# Patient Record
Sex: Female | Born: 1975 | Hispanic: Yes | Marital: Married | Smoking: Never smoker
Health system: Southern US, Community
[De-identification: ages and names within clinical notes are randomized; demographics above are authoritative.]

---

## 2005-08-20 ENCOUNTER — Ambulatory Visit: Payer: Self-pay | Admitting: Family Medicine

## 2020-12-27 ENCOUNTER — Encounter: Payer: Self-pay | Admitting: *Deleted

## 2020-12-27 ENCOUNTER — Ambulatory Visit: Payer: Self-pay | Attending: Oncology | Admitting: *Deleted

## 2020-12-27 ENCOUNTER — Ambulatory Visit
Admission: RE | Admit: 2020-12-27 | Discharge: 2020-12-27 | Disposition: A | Discharge status: Home / Self Care | Payer: Self-pay | Source: Ambulatory Visit | Attending: Oncology | Admitting: Oncology

## 2020-12-27 ENCOUNTER — Other Ambulatory Visit: Payer: Self-pay

## 2020-12-27 VITALS — BP 126/78 | HR 73 | Temp 96.8°F | Ht 64.96 in | Wt 203.3 lb

## 2020-12-27 DIAGNOSIS — Z0000 Encounter for general adult medical examination without abnormal findings: Secondary | ICD-10-CM | POA: Insufficient documentation

## 2020-12-27 NOTE — Progress Notes (Signed)
  Subjective:     Patient ID: Beth Pollard, female   DOB: 1976/01/07, 45 y.o.   MRN: [id number]  HPI   [facility] Medical History Record - 12/27/20 7482      Breast History   Screening cycle New    Provider ([facility]) [facility]    Initial Mammogram 12/27/20    Last Mammogram Never    Recent Breast Symptoms None      Breast Cancer History   Breast Cancer History No personal or family history      Previous History of Breast Problems   Breast Surgery or Biopsy None    Breast Implants N/A    BSE Done Monthly      Gynecological/Obstetrical History   LMP --   Has IUD   Is there any chance that the client could be pregnant?  No    Age at menarche 33    Age at menopause n/a    PAP smear history Annually    Date of last PAP  11/26/20    Provider ([facility]) [facility]    Age at first live birth 35    Breast fed children No    DES Exposure No    Cervical, Uterine or Ovarian cancer No    Family history of Cervial, Uterine or Ovarian cancer No    Hysterectomy No    Cervix removed No    Ovaries removed No    Laser/Cryosurgery No    Current method of birth control --   IUD   Current method of Estrogen/Hormone replacement None    Smoking history None    Comments No insurance            Review of Systems     Objective:   Physical Exam Chest:  Breasts:     Right: Inverted nipple present. No swelling, bleeding, mass, nipple discharge, skin change, tenderness, axillary adenopathy or supraclavicular adenopathy.     Left: No swelling, bleeding, inverted nipple, mass, nipple discharge, skin change, tenderness, axillary adenopathy or supraclavicular adenopathy.        Comments: Bilateral inverted nipples.  Normal for the patient Lymphadenopathy:     Upper Body:     Right upper body: No supraclavicular or axillary adenopathy.     Left upper body: No supraclavicular or axillary adenopathy.        Assessment:     45 year old Hispanic female presents to [facility]  for clinical breast exam and baseline mammogram.   Loyda, the interpreter present during the interview and exam.  Clinical breast exam without dominant mass, skin changes, nipple discharge or lymphadenopathy.  Taught self breast awareness.  Last pap on 11/26/20 at the [facility].  Those results are not available for review.  She should follow up per [facility]P guidelines. Patient has been screened for eligibility.  She does not have any insurance, Medicare or Medicaid.  She also meets financial eligibility.   Risk Assessment    Risk Scores      12/27/2020   Last edited by: Scarlett Presto, RN   5-year risk: 0.7 %   Lifetime risk: 7.4 %            Plan:     Screening mammogram ordered.  Will follow up per [facility] protocol.

## 2020-12-27 NOTE — Patient Instructions (Signed)
Gave patient hand-out, Women Staying Healthy, Active and Well from [facility], with education on breast health, pap smears, heart and colon health. 

## 2020-12-29 NOTE — Progress Notes (Signed)
Letter mailed from the [facility] to inform patient of her normal mammogram results.  Patient is to follow-up with annual screening in one year. 

## 2021-11-04 IMAGING — MG MM DIGITAL SCREENING BILAT W/ TOMO AND CAD
6 of 10 series · 6 of 30 positions shown · non-contrast
Comparison: None.

CLINICAL DATA: Screening.

EXAM:
DIGITAL SCREENING BILATERAL MAMMOGRAM WITH TOMOSYNTHESIS AND CAD
TECHNIQUE: Bilateral screening digital craniocaudal and mediolateral oblique
mammograms were obtained. Bilateral screening digital breast
tomosynthesis was performed. The images were evaluated with
computer-aided detection.

[L MLO synth-2D]
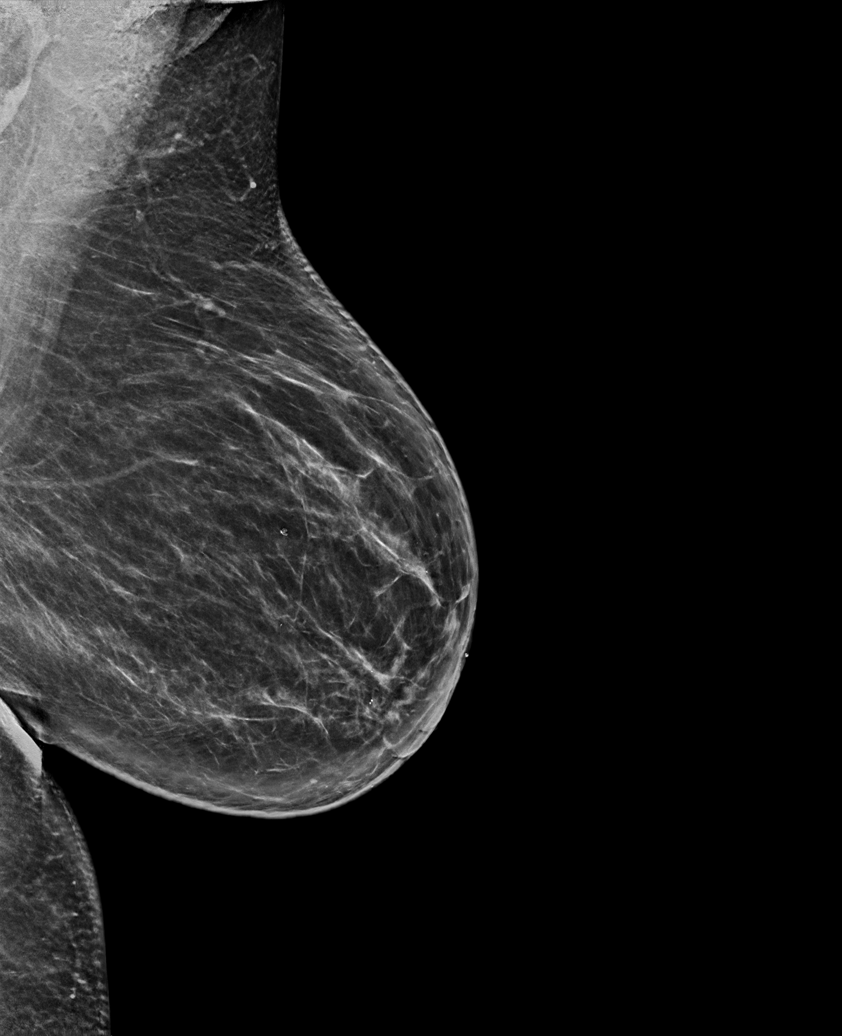

[R CC synth-2D]
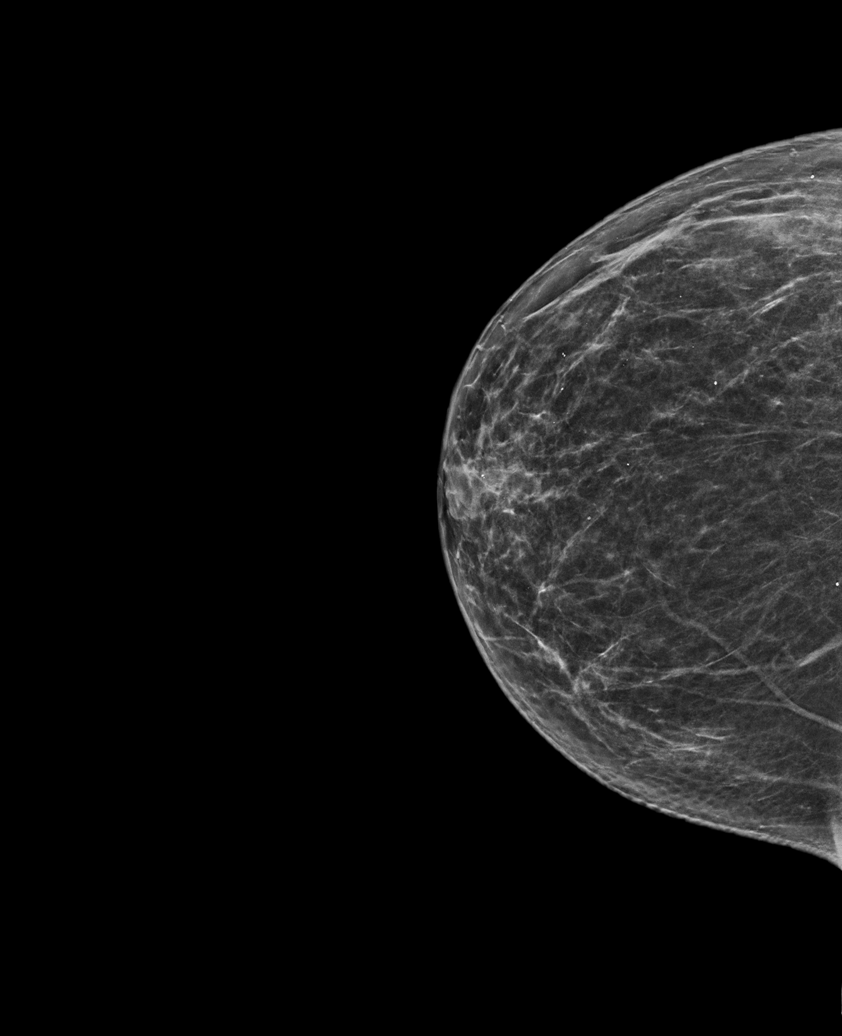

[L CC synth-2D]
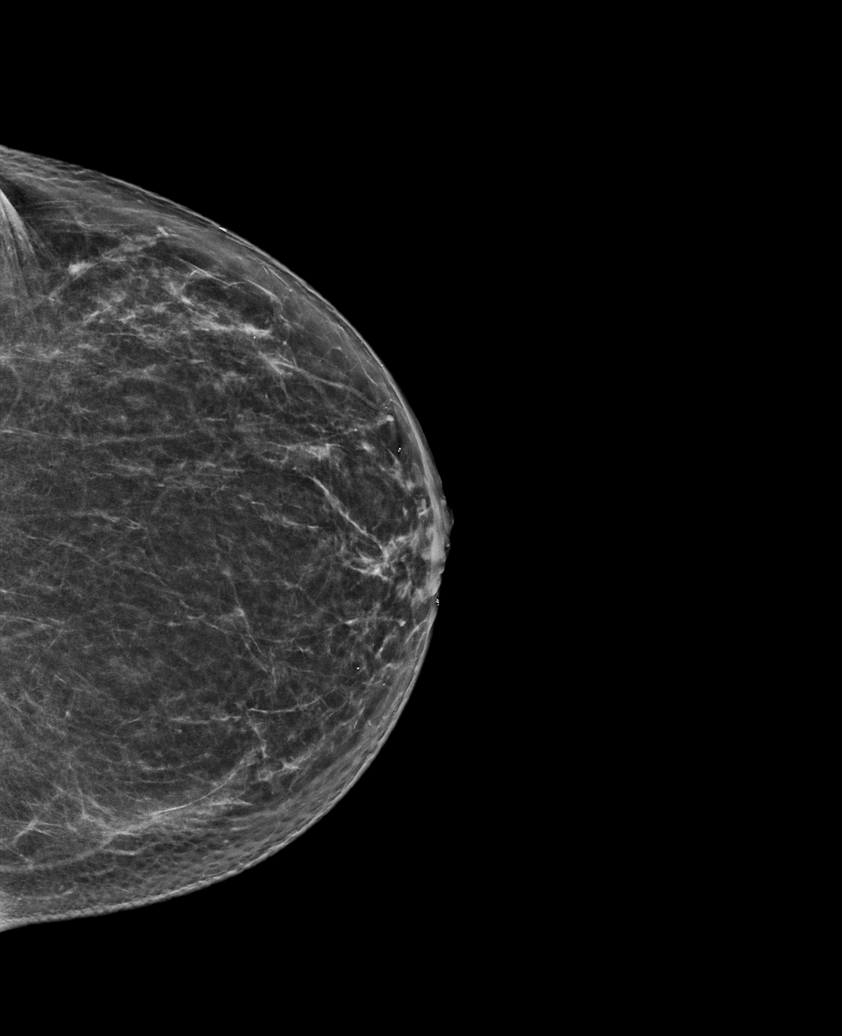

[R MLO synth-2D (1 of 2)]
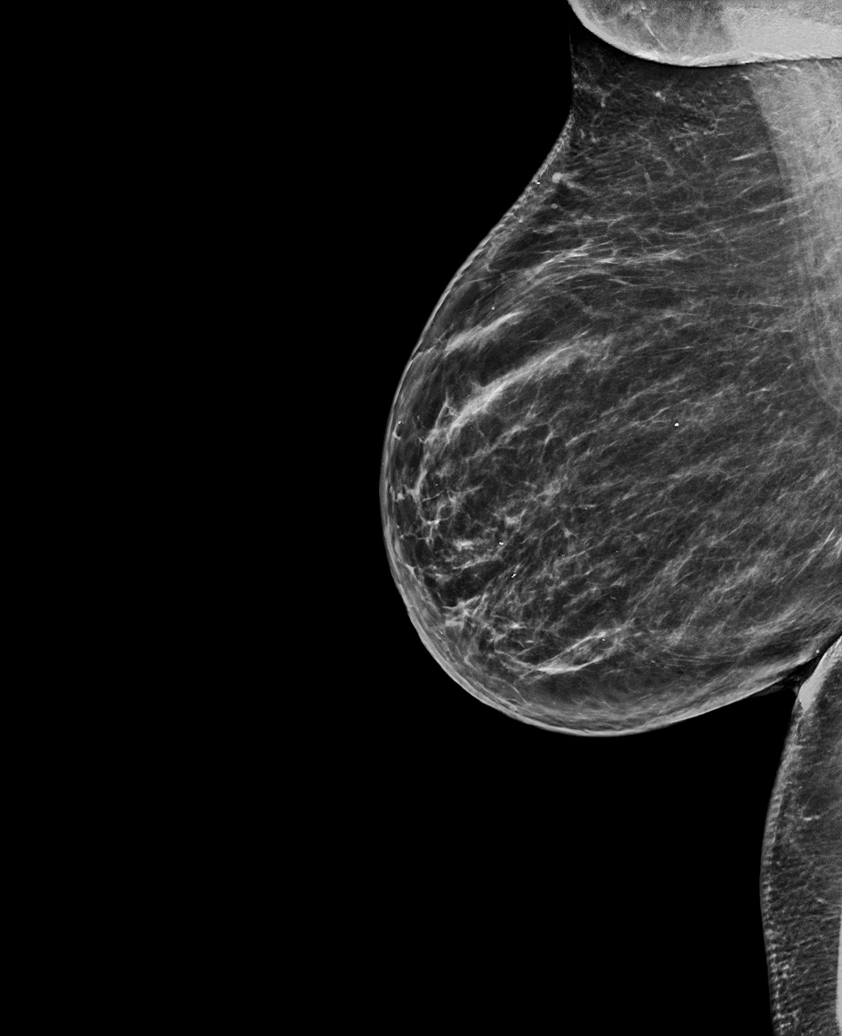

[R MLO synth-2D (2 of 2)]
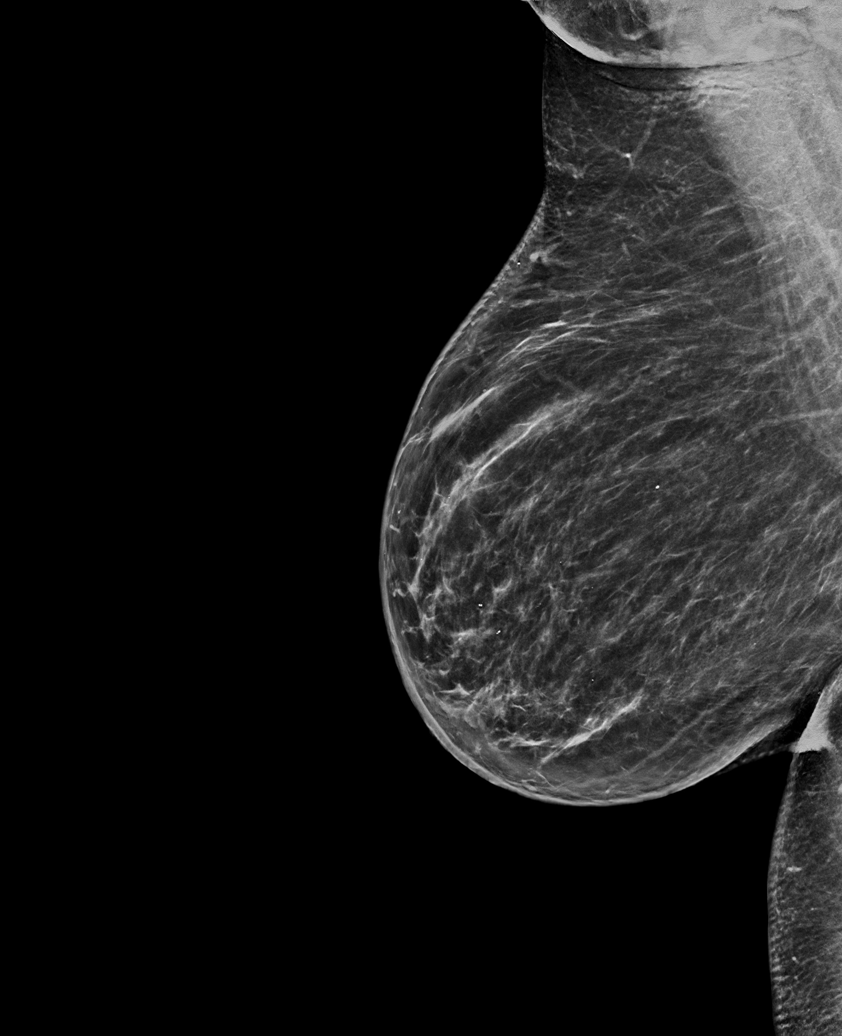

[R CC tomo · tomo slice 34/67.0]
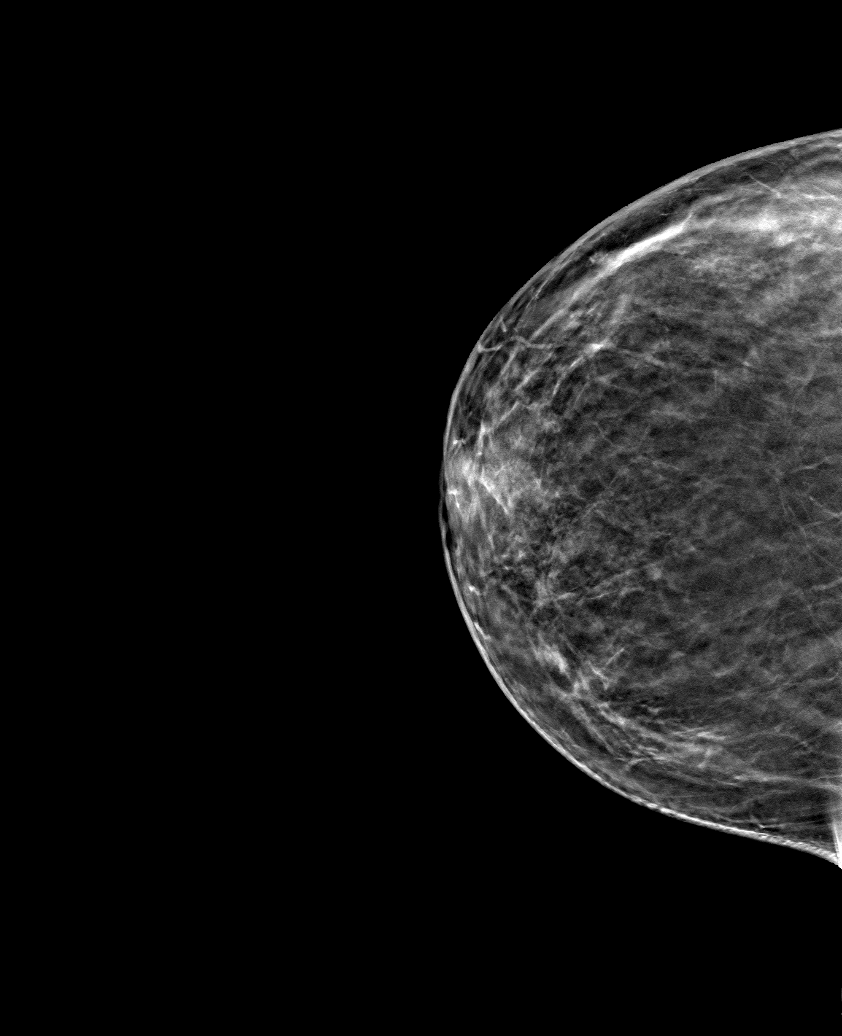

[6 of 30 positions shown; findings below may reference images not displayed]

Baseline.

ACR Breast Density Category b: There are scattered areas of
fibroglandular density.
FINDINGS: There are no findings suspicious for malignancy. The images were
evaluated with computer-aided detection.
IMPRESSION: No mammographic evidence of malignancy. A result letter of this
screening mammogram will be mailed directly to the patient.

RECOMMENDATION:
Screening mammogram in one year. (Code:[id number]-S-JGA)

BI-RADS CATEGORY  1: Negative.

## 2021-11-29 ENCOUNTER — Ambulatory Visit
Admission: RE | Admit: 2021-11-29 | Discharge: 2021-11-29 | Disposition: A | Discharge status: Home / Self Care | Payer: Self-pay | Source: Ambulatory Visit | Attending: Emergency Medicine | Admitting: Emergency Medicine

## 2021-11-29 VITALS — BP 130/71 | HR 88 | Temp 98.1°F | Resp 18

## 2021-11-29 DIAGNOSIS — L239 Allergic contact dermatitis, unspecified cause: Secondary | ICD-10-CM

## 2021-11-29 MED ORDER — PREDNISONE 10 MG (21) PO TBPK: ORAL_TABLET | Freq: Every day | ORAL | 0 refills | Status: DC

## 2021-11-29 MED ORDER — CETIRIZINE HCL 10 MG PO TABS: 10.0000 mg | ORAL_TABLET | Freq: Every day | ORAL | 0 refills | Status: AC

## 2021-11-29 NOTE — Discharge Instructions (Addendum)
Take the prednisone and Zyrtec as directed.  Follow up with your primary care provider if your symptoms are not improving.    

## 2021-11-29 NOTE — ED Provider Notes (Signed)
?[facility] ? ? ? ?CSN: [id number] ?Arrival date & time: 11/29/21  1632 ? ? ?  ? ?History   ?Chief Complaint ?Chief Complaint  ?Patient presents with  ? Allergic Reaction  ?  Rash on neck not sure if it?s a chemical burn or allergic reaction - Entered by patient  ? ? ?HPI ?Beth Pollard is a 46 y.o. female.  Patient presents with a pruritic rash on her neck x2 weeks.  The rash started after she used a new soap product on the area.  She has been treating the area with Aquaphor and Neosporin.  She denies fever, sore throat, difficulty swallowing, cough, shortness of breath, or other symptoms.  She denies pertinent medical history.  She denies current pregnancy or breastfeeding.   ? ? ?The history is provided by the patient.  ? ?History reviewed. No pertinent past medical history. ? ?There are no problems to display for this patient. ? ? ?History reviewed. No pertinent surgical history. ? ?OB History   ?No obstetric history on file. ?  ? ? ? ?Home Medications   ? ?Prior to Admission medications   ?Medication Sig Start Date End Date Taking? Authorizing Provider  ?cetirizine (ZYRTEC ALLERGY) 10 MG tablet Take 1 tablet (10 mg total) by mouth daily. 11/29/21  Yes Sharion Balloon, NP  ?predniSONE (STERAPRED UNI-PAK 21 TAB) 10 MG (21) TBPK tablet Take by mouth daily. As directed 11/29/21  Yes Sharion Balloon, NP  ? ? ?Family History ?History reviewed. No pertinent family history. ? ?Social History ?Social History  ? ?Tobacco Use  ? Smoking status: Never  ? Smokeless tobacco: Never  ?Vaping Use  ? Vaping Use: Never used  ?Substance Use Topics  ? Alcohol use: Never  ? Drug use: Never  ? ? ? ?Allergies   ?Patient has no known allergies. ? ? ?Review of Systems ?Review of Systems  ?Constitutional:  Negative for chills and fever.  ?HENT:  Negative for ear pain, sore throat and trouble swallowing.   ?Respiratory:  Negative for cough and shortness of breath.   ?Cardiovascular:  Negative for chest pain and palpitations.   ?Skin:  Positive for rash. Negative for color change.  ?All other systems reviewed and are negative. ? ? ?Physical Exam ?Triage Vital Signs ?ED Triage Vitals [11/29/21 1701]  ?Enc Vitals Group  ?   BP   ?   Pulse   ?   Resp   ?   Temp   ?   Temp src   ?   SpO2   ?   Weight   ?   Height   ?   Head Circumference   ?   Peak Flow   ?   Pain Score 0  ?   Pain Loc   ?   Pain Edu?   ?   Excl. in [place]?   ? ?No data found. ? ?Updated Vital Signs ?BP 130/71   Pulse 88   Temp 98.1 ?F (36.7 ?C)   Resp 18   LMP 10/15/2021 (Approximate)   SpO2 96%  ? ?Visual Acuity ?Right Eye Distance:   ?Left Eye Distance:   ?Bilateral Distance:   ? ?Right Eye Near:   ?Left Eye Near:    ?Bilateral Near:    ? ?Physical Exam ?Vitals and nursing note reviewed.  ?Constitutional:   ?   General: She is not in acute distress. ?   Appearance: Normal appearance. She is well-developed. She is not ill-appearing.  ?HENT:  ?  Mouth/Throat:  ?   Mouth: Mucous membranes are moist.  ?   Pharynx: Oropharynx is clear.  ?Cardiovascular:  ?   Rate and Rhythm: Normal rate and regular rhythm.  ?   Heart sounds: Normal heart sounds.  ?Pulmonary:  ?   Effort: Pulmonary effort is normal. No respiratory distress.  ?   Breath sounds: Normal breath sounds.  ?Musculoskeletal:  ?   Cervical back: Neck supple.  ?Skin: ?   General: Skin is warm and dry.  ?   Findings: Rash present.  ?   Comments: Erythematous patchy rash on front and sides of neck.  No papules or vesicles.  No open wounds or drainage.  ?Neurological:  ?   Mental Status: She is alert.  ?Psychiatric:     ?   Mood and Affect: Mood normal.     ?   Behavior: Behavior normal.  ? ? ? ?UC Treatments / Results  ?Labs ?(all labs ordered are listed, but only abnormal results are displayed) ?Labs Reviewed - No data to display ? ?EKG ? ? ?Radiology ?No results found. ? ?Procedures ?Procedures (including critical care time) ? ?Medications Ordered in UC ?Medications - No data to display ? ?Initial Impression /  Assessment and Plan / UC Course  ?I have reviewed the triage vital signs and the nursing notes. ? ?Pertinent labs & imaging results that were available during my care of the patient were reviewed by me and considered in my medical decision making (see chart for details). ? ?Allergic dermatitis.  Treating with prednisone and Zyrtec.  Instructed patient to stop using Neosporin and discussed that she can continue using the Aquaphor if needed.  Instructed her to follow-up with her PCP if her symptoms are not improving.  Education provided on contact dermatitis.  Patient agrees to plan of care. ? ? ?Final Clinical Impressions(s) / UC Diagnoses  ? ?Final diagnoses:  ?Allergic dermatitis  ? ? ? ?Discharge Instructions   ? ?  ?Take the prednisone and Zyrtec as directed.  Follow-up with your primary care provider if your symptoms are not improving. ? ? ? ? ?ED Prescriptions   ? ? Medication Sig Dispense Auth. Provider  ? predniSONE (STERAPRED UNI-PAK 21 TAB) 10 MG (21) TBPK tablet Take by mouth daily. As directed 21 tablet Sharion Balloon, NP  ? cetirizine (ZYRTEC ALLERGY) 10 MG tablet Take 1 tablet (10 mg total) by mouth daily. 14 tablet Sharion Balloon, NP  ? ?  ? ?PDMP not reviewed this encounter. ?  ?Sharion Balloon, NP ?11/29/21 1747 ? ?

## 2021-11-29 NOTE — ED Triage Notes (Signed)
Patient presents to Urgent Care with complaints of rash on neck area x 2 weeks ago. Pt states she used "[place] wash" she states this caused the rash. Treating with Aquaphor cream and neosporin. Pt reports rash was improving but became irritated at work today.  ? ? ?

## 2022-08-06 ENCOUNTER — Ambulatory Visit
Admission: RE | Admit: 2022-08-06 | Discharge: 2022-08-06 | Disposition: A | Discharge status: Home / Self Care | Payer: Self-pay | Source: Ambulatory Visit | Attending: Urgent Care | Admitting: Urgent Care

## 2022-08-06 VITALS — BP 130/84 | HR 91 | Temp 97.8°F | Resp 16

## 2022-08-06 DIAGNOSIS — J329 Chronic sinusitis, unspecified: Secondary | ICD-10-CM

## 2022-08-06 DIAGNOSIS — H9202 Otalgia, left ear: Secondary | ICD-10-CM

## 2022-08-06 DIAGNOSIS — J069 Acute upper respiratory infection, unspecified: Secondary | ICD-10-CM

## 2022-08-06 LAB — POCT RAPID STREP A (OFFICE): Rapid Strep A Screen: NEGATIVE

## 2022-08-06 MED ORDER — AZELASTINE HCL 0.1 % NA SOLN: 1.0000 | Freq: Two times a day (BID) | NASAL | 12 refills | Status: AC

## 2022-08-06 MED ORDER — PREDNISONE 10 MG (21) PO TBPK: ORAL_TABLET | Freq: Every day | ORAL | 0 refills | Status: AC

## 2022-08-06 NOTE — Discharge Instructions (Addendum)
You have been diagnosed with a viral upper respiratory infection based on your symptoms and exam. Viral illnesses cannot be treated with antibiotics - they are self limiting - and you should find your symptoms resolving within a few days. Get plenty of rest and non-caffeinated fluids. Watch for signs of dehydration including reduced urine output and dark colored urine.  We recommend you use over-the-counter medications for symptom control including acetaminophen (Tylenol), ibuprofen (Advil/Motrin) or naproxen (Aleve) for throat pain, fever, chills or body aches. You may combine use of acetaminophen and ibuprofen/naproxen if needed.  Some patients find an pain-relieving throat spray such as Chloraseptic to be effective.  Also recommend cold/cough medication containing a cough suppressant such as dextromethorphan, as needed. Please note that some cough medications are not recommended if you suffer from hypertension.    Saline mist spray is helpful for removing excess mucus from your nose.  Room humidifiers are helpful to ease breathing at night. I recommend guaifenesin (Mucinex) with plenty of water throughout the day to help thin and loosen mucus secretions in your respiratory passages.   If appropriate based upon your other medical problems, you might also find relief of nasal/sinus congestion symptoms by using a nasal decongestant such as fluticasone (Flonase ) or pseudoephedrine (Sudafed sinus).  You will need to obtain Sudafed from behind the pharmacist counter.  Speak to the pharmacist to verify that you are not duplicating medications with other over-the-counter formulations that you may be using.   

## 2022-08-06 NOTE — ED Provider Notes (Signed)
Beth Pollard    CSN: [id number] Arrival date & time: 08/06/22  1015      History   Chief Complaint Chief Complaint  Patient presents with   Sore Throat    Really bad sore throat. Difficult to swallow water. Youngest son tested positive for Flu. - Entered by patient    HPI Beth Pollard is a 47 y.o. female.    Sore Throat    Patient presents urgent care for complaint of sore throat and left ear pain starting yesterday.  She endorses nasal congestion since Saturday (3 days).  Patient states her son tested positive for flu.  History reviewed. No pertinent past medical history.  There are no problems to display for this patient.   History reviewed. No pertinent surgical history.  OB History   No obstetric history on file.      Home Medications    Prior to Admission medications   Medication Sig Start Date End Date Taking? Authorizing Provider  cetirizine (ZYRTEC ALLERGY) 10 MG tablet Take 1 tablet (10 mg total) by mouth daily. 11/29/21   Sharion Balloon, NP  predniSONE (STERAPRED UNI-PAK 21 TAB) 10 MG (21) TBPK tablet Take by mouth daily. As directed 11/29/21   Sharion Balloon, NP    Family History History reviewed. No pertinent family history.  Social History Social History   Tobacco Use   Smoking status: Never   Smokeless tobacco: Never  Vaping Use   Vaping Use: Never used  Substance Use Topics   Alcohol use: Never   Drug use: Never     Allergies   Patient has no known allergies.   Review of Systems Review of Systems   Physical Exam Triage Vital Signs ED Triage Vitals [08/06/22 1035]  Enc Vitals Group     BP 130/84     Pulse Rate 91     Resp 16     Temp 97.8 F (36.6 C)     Temp Source Temporal     SpO2 98 %     Weight      Height      Head Circumference      Peak Flow      Pain Score      Pain Loc      Pain Edu?      Excl. in [place]?    No data found.  Updated Vital Signs BP 130/84 (BP Location: Left Arm)    Pulse 91   Temp 97.8 F (36.6 C) (Temporal)   Resp 16   SpO2 98%   Visual Acuity Right Eye Distance:   Left Eye Distance:   Bilateral Distance:    Right Eye Near:   Left Eye Near:    Bilateral Near:     Physical Exam Vitals reviewed.  Constitutional:      Appearance: She is well-developed.  HENT:     Right Ear: Tympanic membrane normal. Tympanic membrane is not erythematous.     Left Ear: Tympanic membrane normal. Tympanic membrane is not erythematous.     Mouth/Throat:     Mouth: Mucous membranes are moist.     Pharynx: No oropharyngeal exudate.  Cardiovascular:     Rate and Rhythm: Normal rate and regular rhythm.     Heart sounds: Normal heart sounds.  Pulmonary:     Effort: Pulmonary effort is normal.     Breath sounds: Normal breath sounds.  Skin:    General: Skin is warm and dry.  Neurological:  General: No focal deficit present.     Mental Status: She is alert and oriented to person, place, and time.  Psychiatric:        Mood and Affect: Mood normal.        Behavior: Behavior normal.      UC Treatments / Results  Labs (all labs ordered are listed, but only abnormal results are displayed) Labs Reviewed  POCT RAPID STREP A (OFFICE)    EKG   Radiology No results found.  Procedures Procedures (including critical care time)  Medications Ordered in UC Medications - No data to display  Initial Impression / Assessment and Plan / UC Course  I have reviewed the triage vital signs and the nursing notes.  Pertinent labs & imaging results that were available during my care of the patient were reviewed by me and considered in my medical decision making (see chart for details).   Patient is afebrile here without recent antipyretics. Satting well on room air. Overall is well appearing, well hydrated, without respiratory distress. Pulmonary exam is unremarkable.  Lungs CTAB without wheezing, rhonchi, rales.  No pharyngeal erythema or peritonsillar exudates.   TMs appear WNL bilaterally.  Rapid strep is negative.  Suspect acute viral process and recommending OTC medication for symptom control.  Final Clinical Impressions(s) / UC Diagnoses   Final diagnoses:  None   Discharge Instructions   None    ED Prescriptions   None    PDMP not reviewed this encounter.   Rose Phi, [place] 08/06/22 1052

## 2022-08-06 NOTE — ED Triage Notes (Signed)
Patient presents to UC for sore throat and left ear pain since yesterday. Nasal congestion since Saturday. Patient's son tested positive for Flu. Taking Tylenol for pain.

## 2022-11-08 ENCOUNTER — Ambulatory Visit (INDEPENDENT_AMBULATORY_CARE_PROVIDER_SITE_OTHER): Payer: Self-pay

## 2022-11-08 ENCOUNTER — Ambulatory Visit (INDEPENDENT_AMBULATORY_CARE_PROVIDER_SITE_OTHER): Payer: Self-pay | Admitting: Podiatry

## 2022-11-08 DIAGNOSIS — M778 Other enthesopathies, not elsewhere classified: Secondary | ICD-10-CM

## 2022-11-08 DIAGNOSIS — M19079 Primary osteoarthritis, unspecified ankle and foot: Secondary | ICD-10-CM

## 2022-11-08 NOTE — Progress Notes (Signed)
  Subjective:  Patient ID: Beth Pollard, female    DOB: [birth year],  MRN: [id number]  Chief Complaint  Patient presents with   Foot Pain    Pt stated that about 2 years ago she was diagnosed with plantar fasciitis but for the last month she has been having pain on the top of her foot.  No injuries    47 y.o. female presents with the above complaint.  Patient presents with bilateral talonavicular joint pain.  Patient states been present for quite some time is progressive gotten worse.  She has a history of Planter fasciitis but her pain is on top of the foot.  Hurts with ambulation worse with pressure she has not seen MRIs prior to seeing me she would like to discuss treatment options for this.   Review of Systems: Negative except as noted in the HPI. Denies N/V/F/Ch.  No past medical history on file.  Current Outpatient Medications:    azelastine (ASTELIN) 0.1 % nasal spray, Place 1 spray into both nostrils 2 (two) times daily. Use in each nostril as directed, Disp: 30 mL, Rfl: 12   cetirizine (ZYRTEC ALLERGY) 10 MG tablet, Take 1 tablet (10 mg total) by mouth daily., Disp: 14 tablet, Rfl: 0   predniSONE (STERAPRED UNI-PAK 21 TAB) 10 MG (21) TBPK tablet, Take by mouth daily. Take 6 tabs by mouth daily for 1 day, then 5 tabs for 1 day, then 4 tabs for 1 day, then 3 tabs for 1 day, then 2 tabs for 1 day, then 1 tab by mouth daily for 1 day, Disp: 21 tablet, Rfl: 0  Social History   Tobacco Use  Smoking Status Never  Smokeless Tobacco Never    No Known Allergies Objective:  There were no vitals filed for this visit. There is no height or weight on file to calculate BMI. Constitutional Well developed. Well nourished.  Vascular Dorsalis pedis pulses palpable bilaterally. Posterior tibial pulses palpable bilaterally. Capillary refill normal to all digits.  No cyanosis or clubbing noted. Pedal hair growth normal.  Neurologic Normal speech. Oriented to person, place,  and time. Epicritic sensation to light touch grossly present bilaterally.  Dermatologic Nails well groomed and normal in appearance. No open wounds. No skin lesions.  Orthopedic: Pain at the talonavicular joint bilaterally clinically unable to appreciate some arthritic spurring underneath/dorsal aspect of the TN joint.  Some midfoot arthritis clinically appreciated as well.  No pain at the ATFL ligament peroneal tendon posterior tibial tendon.  No pain with ankle joint range of motion   Radiographs: 3 views of skeletally mature adult left foot talonavicular joint arthritis noted with uneven joint space narrowing and dorsal spurring midfoot arthritis noted.  No other bony abnormalities are identified posterior heel spurring noted Assessment:   1. Osteoarthritis of talonavicular joint   2. Capsulitis of left foot   3. Capsulitis of right foot    Plan:  Patient was evaluated and treated and all questions answered.  Bilateral talonavicular joint arthritis mild to moderate -All questions and concerns were discussed with the patient in extensive detail -Given the amount of pain that she is having she will benefit from a steroid injection to help decrease inflammatory component associate with pain.  Patient agrees with plan like to proceed with steroid injection -A steroid injection was performed at bilateral TN joint using 1% plain Lidocaine and 10 mg of Kenalog. This was well tolerated.   No follow-ups on file. Bilateral tn arthritis injection x 2

## 2022-12-10 ENCOUNTER — Ambulatory Visit (INDEPENDENT_AMBULATORY_CARE_PROVIDER_SITE_OTHER): Payer: Self-pay | Admitting: Podiatry

## 2022-12-10 DIAGNOSIS — M19079 Primary osteoarthritis, unspecified ankle and foot: Secondary | ICD-10-CM

## 2022-12-10 DIAGNOSIS — M778 Other enthesopathies, not elsewhere classified: Secondary | ICD-10-CM

## 2022-12-10 NOTE — Progress Notes (Signed)
  Subjective:  Patient ID: Beth Pollard, female    DOB: Jul 09, 1976,  MRN: [id number]  Chief Complaint  Patient presents with   Foot Pain    Pt stated that she is doing much better the injections helped a lot     47 y.o. female presents with the above complaint.  Patient presents for follow-up of talonavicular joint arthritis.  She states the injection helped a lot.  She still has some residual pain.  She would like to know if she can do another shot.   Review of Systems: Negative except as noted in the HPI. Denies N/V/F/Ch.  No past medical history on file.  Current Outpatient Medications:    azelastine (ASTELIN) 0.1 % nasal spray, Place 1 spray into both nostrils 2 (two) times daily. Use in each nostril as directed, Disp: 30 mL, Rfl: 12   cetirizine (ZYRTEC ALLERGY) 10 MG tablet, Take 1 tablet (10 mg total) by mouth daily., Disp: 14 tablet, Rfl: 0   predniSONE (STERAPRED UNI-PAK 21 TAB) 10 MG (21) TBPK tablet, Take by mouth daily. Take 6 tabs by mouth daily for 1 day, then 5 tabs for 1 day, then 4 tabs for 1 day, then 3 tabs for 1 day, then 2 tabs for 1 day, then 1 tab by mouth daily for 1 day, Disp: 21 tablet, Rfl: 0  Social History   Tobacco Use  Smoking Status Never  Smokeless Tobacco Never    No Known Allergies Objective:  There were no vitals filed for this visit. There is no height or weight on file to calculate BMI. Constitutional Well developed. Well nourished.  Vascular Dorsalis pedis pulses palpable bilaterally. Posterior tibial pulses palpable bilaterally. Capillary refill normal to all digits.  No cyanosis or clubbing noted. Pedal hair growth normal.  Neurologic Normal speech. Oriented to person, place, and time. Epicritic sensation to light touch grossly present bilaterally.  Dermatologic Nails well groomed and normal in appearance. No open wounds. No skin lesions.  Orthopedic: Pain at the talonavicular joint bilaterally clinically unable to  appreciate some arthritic spurring underneath/dorsal aspect of the TN joint.  Some midfoot arthritis clinically appreciated as well.  No pain at the ATFL ligament peroneal tendon posterior tibial tendon.  No pain with ankle joint range of motion   Radiographs: 3 views of skeletally mature adult left foot talonavicular joint arthritis noted with uneven joint space narrowing and dorsal spurring midfoot arthritis noted.  No other bony abnormalities are identified posterior heel spurring noted Assessment:   No diagnosis found.  Plan:  Patient was evaluated and treated and all questions answered.  Bilateral talonavicular joint arthritis mild to moderate -All questions and concerns were discussed with the patient in extensive detail -Given the amount of pain that she is having she will benefit from a steroid injection to help decrease inflammatory component associate with pain.  Patient agrees with plan like to proceed with steroid injection -A second steroid injection was performed at bilateral TN joint using 1% plain Lidocaine and 10 mg of Kenalog. This was well tolerated.   No follow-ups on file.

## 2022-12-19 ENCOUNTER — Encounter: Payer: Self-pay | Admitting: Podiatry

## 2023-06-30 ENCOUNTER — Encounter: Payer: Self-pay | Admitting: Family Medicine

## 2023-07-03 ENCOUNTER — Telehealth: Payer: Self-pay | Admitting: *Deleted

## 2023-07-21 ENCOUNTER — Other Ambulatory Visit: Payer: Self-pay | Admitting: Family Medicine

## 2023-07-21 DIAGNOSIS — Z1231 Encounter for screening mammogram for malignant neoplasm of breast: Secondary | ICD-10-CM

## 2023-07-30 ENCOUNTER — Ambulatory Visit
Admission: RE | Admit: 2023-07-30 | Discharge: 2023-07-30 | Disposition: A | Discharge status: Home / Self Care | Payer: Self-pay | Source: Ambulatory Visit | Attending: Family Medicine | Admitting: Family Medicine

## 2023-07-30 DIAGNOSIS — Z1231 Encounter for screening mammogram for malignant neoplasm of breast: Secondary | ICD-10-CM | POA: Insufficient documentation

## 2024-01-21 ENCOUNTER — Encounter: Payer: Self-pay | Admitting: Podiatry

## 2024-01-21 ENCOUNTER — Ambulatory Visit (INDEPENDENT_AMBULATORY_CARE_PROVIDER_SITE_OTHER): Payer: Self-pay | Admitting: Podiatry

## 2024-01-21 DIAGNOSIS — B351 Tinea unguium: Secondary | ICD-10-CM

## 2024-01-21 MED ORDER — TERBINAFINE HCL 250 MG PO TABS: 250.0000 mg | ORAL_TABLET | Freq: Every day | ORAL | 0 refills | Status: AC

## 2024-01-22 NOTE — Progress Notes (Signed)
  Subjective:  Patient ID: Beth Pollard, female    DOB: 09/19/1975,  MRN: [id number]  Chief Complaint  Patient presents with   Nail Problem    Rm6 Nail fungus right great toe and left 5th toe/ thick brown discoloration/tried otc treatments with no improvement/ 1 year    48 y.o. female presents with the above complaint. History confirmed with patient.  Her daughter is present and translates from [place] to [place] for her  Objective:  Physical Exam: warm, good capillary refill, no trophic changes or ulcerative lesions, normal DP and PT pulses, normal sensory exam, and onychomycosis.     Assessment:   1. Onychomycosis      Plan:  Patient was evaluated and treated and all questions answered.  Onychomycosis -Educated on etiology of nail fungus. -Discussed oral topical and laser therapy and risks and benefits of each -eRx for oral terbinafine #90. Educated on risks and benefits of the medication. -Photographs taken   Return in about 4 months (around 05/23/2024) for follow up after nail fungus treatment.

## 2024-05-26 ENCOUNTER — Ambulatory Visit (INDEPENDENT_AMBULATORY_CARE_PROVIDER_SITE_OTHER): Payer: Self-pay | Admitting: Podiatry

## 2024-05-26 VITALS — Ht 64.96 in | Wt 203.0 lb

## 2024-05-26 DIAGNOSIS — B351 Tinea unguium: Secondary | ICD-10-CM

## 2024-05-26 MED ORDER — TERBINAFINE HCL 250 MG PO TABS: 250.0000 mg | ORAL_TABLET | Freq: Every day | ORAL | 0 refills | Status: AC

## 2024-05-27 NOTE — Progress Notes (Signed)
  Subjective:  Patient ID: Beth Pollard, female    DOB: 11-18-1975,  MRN: [id number]  Chief Complaint  Patient presents with   Nail Problem    Rm 7 Patient is here to f/u on nail fungus of the right hallux. Nail is thick and discolored. Pt states completing  prescription meds 1 month ago,  will need refill.    48 y.o. female presents with the above complaint. History confirmed with patient.  Her daughter is present and translates from English to Spanish for her  Objective:  Physical Exam: warm, good capillary refill, no trophic changes or ulcerative lesions, normal DP and PT pulses, normal sensory exam, and onychomycosis.     Assessment:   1. Onychomycosis      Plan:  Patient was evaluated and treated and all questions answered.  Onychomycosis - Has had quite a bit of improvement about 50% clearance.  I recommended continuing Lamisil  therapy.  Refill was sent.   Return in about 4 months (around 09/23/2024) for follow up after nail fungus treatment.

## 2024-08-23 ENCOUNTER — Ambulatory Visit: Payer: Self-pay | Admitting: Podiatry

## 2024-09-29 ENCOUNTER — Ambulatory Visit: Payer: Self-pay | Admitting: Podiatry
